# Patient Record
Sex: Female | Born: 1965 | Race: White | Hispanic: No | Marital: Married | Smoking: Never smoker
Health system: Southern US, Community
[De-identification: ages and names within clinical notes are randomized; demographics above are authoritative.]

## PROBLEM LIST (undated history)

## (undated) HISTORY — PX: ABDOMINAL HYSTERECTOMY: SHX81

---

## 2007-02-24 ENCOUNTER — Ambulatory Visit: Payer: Self-pay

## 2007-08-12 ENCOUNTER — Ambulatory Visit: Payer: Self-pay | Admitting: Family Medicine

## 2007-09-09 ENCOUNTER — Ambulatory Visit: Payer: Self-pay | Admitting: Family Medicine

## 2012-02-04 ENCOUNTER — Ambulatory Visit: Payer: Self-pay

## 2012-02-27 ENCOUNTER — Ambulatory Visit: Payer: Self-pay

## 2012-02-27 LAB — HEMATOCRIT: HCT: 39.3 % (ref 35.0–47.0)

## 2012-03-10 ENCOUNTER — Ambulatory Visit: Payer: Self-pay

## 2012-03-10 LAB — PREGNANCY, URINE: Pregnancy Test, Urine: NEGATIVE m[IU]/mL

## 2012-03-11 LAB — HEMOGLOBIN: HGB: 11.7 g/dL — ABNORMAL LOW (ref 12.0–16.0)

## 2012-03-12 LAB — PATHOLOGY REPORT

## 2015-04-16 NOTE — Op Note (Signed)
PATIENT NAME:  Catherine CroftYNE, Dulse M MR#:  [id number] DATE OF BIRTH:  February 06, 1966  DATE OF PROCEDURE:  03/10/2012  PREOPERATIVE DIAGNOSIS: Menorrhagia unresponsive to conservative therapy.   POSTOPERATIVE DIAGNOSIS: Menorrhagia unresponsive to conservative therapy.   PROCEDURE: Laparoscopic supracervical hysterectomy.   SURGEON: Deloris Pinghilip J. Luella Cookosenow, MD  FIRST ASSISTANT: Adria Devonarrie Klett, MD   OPERATIVE FINDINGS: Large boggy uterus, both tubes and ovaries appeared to be normal therefore left in situ.   DESCRIPTION OF PROCEDURE: After adequate general anesthesia, the patient was prepped and draped in routine fashion. An infraumbilical incision was made through the skin with instillation of approximately 3 liters of carbon dioxide without incident. During insufflation a uterine manipulator was placed and the bladder was drained. The laparoscope was then inserted. The above findings were noted. Accessory ports were placed in the left and right lower quadrants. The fundus of the uterus was grasped with a grasping tenaculum. The tube, suspensory ligament, ovaries, and the broad ligaments were serially clamped with the Harmonic scalpel and divided. Round ligaments likewise were divided with the Harmonic scalpel. A bladder flap was created, and the bladder was pushed down. The uterine vessels were coagulated with bipolar forceps on the left side. A like procedure was then carried out on the other side. The specimen was amputated using the Harmonic scalpel. The endocervical canal was cauterized with bipolar forceps. The pelvis was lavaged with copious amounts of saline. The specimen was removed using the morcellator. Interceed was then placed and CO2 was allowed to escape after a         fascial stitch closed with the morcellator site. The skin was closed in routine fashion. The patient tolerated the procedure well and left the Operating Room in good condition. Sponge and needle counts were said to be correct at the  end of the procedure. Estimated blood loss was 25 mL. ____________________________ Deloris PingPhilip J. Luella Cookosenow, MD pjr:slb D: 03/10/2012 12:40:26 ET T: 03/10/2012 13:07:48 ET JOB#: [id number]  cc: Deloris PingPhilip J. Luella Cookosenow, MD, <Dictator> Towana BadgerPHILIP J Sandrina Heaton MD ELECTRONICALLY SIGNED 03/16/2012 14:49

## 2016-10-10 ENCOUNTER — Other Ambulatory Visit: Payer: Self-pay | Admitting: Family Medicine

## 2016-10-10 DIAGNOSIS — Z1231 Encounter for screening mammogram for malignant neoplasm of breast: Secondary | ICD-10-CM

## 2016-10-11 ENCOUNTER — Other Ambulatory Visit: Payer: Self-pay | Admitting: Family Medicine

## 2016-10-11 ENCOUNTER — Ambulatory Visit
Admission: RE | Admit: 2016-10-11 | Discharge: 2016-10-11 | Disposition: A | Discharge status: Home / Self Care | Payer: Managed Care, Other (non HMO) | Source: Ambulatory Visit | Attending: Family Medicine | Admitting: Family Medicine

## 2016-10-11 DIAGNOSIS — Z1231 Encounter for screening mammogram for malignant neoplasm of breast: Secondary | ICD-10-CM | POA: Insufficient documentation

## 2016-10-17 ENCOUNTER — Other Ambulatory Visit: Payer: Self-pay | Admitting: *Deleted

## 2016-10-17 ENCOUNTER — Other Ambulatory Visit: Payer: Self-pay | Admitting: Family Medicine

## 2016-10-17 DIAGNOSIS — N6310 Unspecified lump in the right breast, unspecified quadrant: Secondary | ICD-10-CM

## 2016-10-31 ENCOUNTER — Ambulatory Visit
Admission: RE | Admit: 2016-10-31 | Discharge: 2016-10-31 | Disposition: A | Discharge status: Home / Self Care | Payer: Managed Care, Other (non HMO) | Source: Ambulatory Visit | Attending: Family Medicine | Admitting: Family Medicine

## 2016-10-31 DIAGNOSIS — N6310 Unspecified lump in the right breast, unspecified quadrant: Secondary | ICD-10-CM | POA: Insufficient documentation

## 2016-11-07 ENCOUNTER — Other Ambulatory Visit: Payer: Self-pay | Admitting: Family Medicine

## 2016-11-07 DIAGNOSIS — N6310 Unspecified lump in the right breast, unspecified quadrant: Secondary | ICD-10-CM

## 2016-11-11 ENCOUNTER — Ambulatory Visit
Admission: RE | Admit: 2016-11-11 | Discharge: 2016-11-11 | Disposition: A | Discharge status: Home / Self Care | Payer: Managed Care, Other (non HMO) | Source: Ambulatory Visit | Attending: Family Medicine | Admitting: Family Medicine

## 2016-11-11 DIAGNOSIS — N6310 Unspecified lump in the right breast, unspecified quadrant: Secondary | ICD-10-CM | POA: Diagnosis present

## 2016-11-11 HISTORY — PX: BREAST BIOPSY: SHX20

## 2016-11-12 LAB — SURGICAL PATHOLOGY

## 2017-11-20 ENCOUNTER — Ambulatory Visit
Admission: EM | Admit: 2017-11-20 | Discharge: 2017-11-20 | Disposition: A | Discharge status: Home / Self Care | Payer: 59 | Attending: Family Medicine | Admitting: Family Medicine

## 2017-11-20 ENCOUNTER — Other Ambulatory Visit: Payer: Self-pay

## 2017-11-20 ENCOUNTER — Encounter: Payer: Self-pay | Admitting: Emergency Medicine

## 2017-11-20 ENCOUNTER — Ambulatory Visit (INDEPENDENT_AMBULATORY_CARE_PROVIDER_SITE_OTHER): Payer: 59

## 2017-11-20 DIAGNOSIS — R05 Cough: Secondary | ICD-10-CM

## 2017-11-20 DIAGNOSIS — R062 Wheezing: Secondary | ICD-10-CM | POA: Diagnosis not present

## 2017-11-20 DIAGNOSIS — J069 Acute upper respiratory infection, unspecified: Secondary | ICD-10-CM

## 2017-11-20 DIAGNOSIS — R059 Cough, unspecified: Secondary | ICD-10-CM

## 2017-11-20 MED ORDER — HYDROCOD POLST-CPM POLST ER 10-8 MG/5ML PO SUER: 5.0000 mL | Freq: Every evening | ORAL | 0 refills | Status: DC | PRN

## 2017-11-20 MED ORDER — PREDNISONE 20 MG PO TABS: 40.0000 mg | ORAL_TABLET | Freq: Every day | ORAL | 0 refills | Status: DC

## 2017-11-20 MED ORDER — ALBUTEROL SULFATE HFA 108 (90 BASE) MCG/ACT IN AERS: 2.0000 | INHALATION_SPRAY | RESPIRATORY_TRACT | 0 refills | Status: AC | PRN

## 2017-11-20 MED ORDER — IPRATROPIUM-ALBUTEROL 0.5-2.5 (3) MG/3ML IN SOLN
3.0000 mL | Freq: Once | RESPIRATORY_TRACT | Status: AC
  Administered 2017-11-20: 3 mL via RESPIRATORY_TRACT

## 2017-11-20 MED ORDER — BENZONATATE 100 MG PO CAPS: 100.0000 mg | ORAL_CAPSULE | Freq: Three times a day (TID) | ORAL | 0 refills | Status: DC | PRN

## 2017-11-20 MED ORDER — DOXYCYCLINE HYCLATE 100 MG PO CAPS: 100.0000 mg | ORAL_CAPSULE | Freq: Two times a day (BID) | ORAL | 0 refills | Status: DC

## 2017-11-20 NOTE — Discharge Instructions (Signed)
Take medication as prescribed. Rest. Drink plenty of fluids.  ° °Follow up with your primary care physician this week as needed. Return to Urgent care for new or worsening concerns.  ° °

## 2017-11-20 NOTE — ED Provider Notes (Signed)
[facility] ____________________________________________  Time seen: Approximately 8:56 AM  I have reviewed the triage vital signs and the nursing notes.   HISTORY  Chief Complaint Cough   HPI Catherine Hogan is a 51 y.o. female presenting for evaluation of 1.5 weeks of cough and chest congestion.  States occasional throat irritation, mostly associated with cough.  Denies nasal congestion or sinus pain.  States may have intermittently felt she has had a fever, has not actually measured her temperature.  States has been taking over-the-counter ibuprofen, Delsym and some cough and congestion combination agents with minimal improvement, no resolution.  States over the last 4 days the cough and chest congestion has increased.  States cough is worse at night.  States cough is occasionally productive of thick whitish and thick greenish mucus.  Denies pain with breathing.  States felt like she occasionally has heard herself wheeze, mostly at night.  Denies history of asthma, pneumonia or COPD.  Not a smoker.  States her son recently was sick with some similar, as well as her husband who had pneumonia.  Denies chest pain.  Reports continues to eat and drink well.  Reports continues to remain active.  Denies history of PE or DVT.  Denies recent surgery or immobilization.  Reports otherwise feels well. Denies chest pain, shortness of breath, abdominal pain, dysuria, extremity pain, extremity swelling or rash. Denies recent sickness. Denies recent antibiotic use.    Cyndie Mull, DO: PCP No LMP recorded. Patient has had a hysterectomy.   History reviewed. No pertinent past medical history.  There are no active problems to display for this patient.   Past Surgical History:  Procedure Laterality Date  . ABDOMINAL HYSTERECTOMY       No current facility-administered medications for this encounter.   Current Outpatient Medications:  .  albuterol (PROVENTIL HFA;VENTOLIN  HFA) 108 (90 Base) MCG/ACT inhaler, Inhale 2 puffs into the lungs every 4 (four) hours as needed for wheezing or shortness of breath., Disp: 1 Inhaler, Rfl: 0 .  benzonatate (TESSALON PERLES) 100 MG capsule, Take 1 capsule (100 mg total) by mouth 3 (three) times daily as needed for cough., Disp: 15 capsule, Rfl: 0 .  chlorpheniramine-HYDROcodone (TUSSIONEX PENNKINETIC ER) 10-8 MG/5ML SUER, Take 5 mLs by mouth at bedtime as needed for cough. do not drive or operate machinery while taking as can cause drowsiness., Disp: 75 mL, Rfl: 0 .  doxycycline (VIBRAMYCIN) 100 MG capsule, Take 1 capsule (100 mg total) by mouth 2 (two) times daily., Disp: 20 capsule, Rfl: 0 .  predniSONE (DELTASONE) 20 MG tablet, Take 2 tablets (40 mg total) by mouth daily., Disp: 10 tablet, Rfl: 0  Allergies Patient has no known allergies.  Family History  Problem Relation Age of Onset  . Diabetes Father   . Hypertension Father     Social History Social History   Tobacco Use  . Smoking status: Never Smoker  . Smokeless tobacco: Never Used  Substance Use Topics  . Alcohol use: Yes  . Drug use: No    Review of Systems Constitutional: No fever/chills ENT: As above.  Cardiovascular: Denies chest pain. Respiratory: Denies shortness of breath. Gastrointestinal: No abdominal pain.  No nausea, no vomiting.  No diarrhea.  Genitourinary: Negative for dysuria. Musculoskeletal: Negative for back pain. Skin: Negative for rash.   ____________________________________________   PHYSICAL EXAM:  VITAL SIGNS: ED Triage Vitals  Enc Vitals Group     BP 11/20/17 0835 128/78     Pulse Rate  11/20/17 0835 95     Resp 11/20/17 0835 14     Temp 11/20/17 0835 98.6 F (37 C)     Temp Source 11/20/17 0835 Oral     SpO2 11/20/17 0835 98 %     Weight 11/20/17 0832 179 lb 6.4 oz (81.4 kg)     Height 11/20/17 0832 5\' 5"  (1.651 m)     Head Circumference --      Peak Flow --      Pain Score 11/20/17 0832 5     Pain Loc --       Pain Edu? --      Excl. in GC? --    Constitutional: Alert and oriented. Well appearing and in no acute distress. Eyes: Conjunctivae are normal.  Head: Atraumatic. No sinus tenderness to palpation. No swelling. No erythema.  Ears: no erythema, normal TMs bilaterally.   Nose:No nasal congestion   Mouth/Throat: Mucous membranes are moist. No pharyngeal erythema. No tonsillar swelling or exudate.  Neck: No stridor.  No cervical spine tenderness to palpation. Hematological/Lymphatic/Immunilogical: No cervical lymphadenopathy. Cardiovascular: Normal rate, regular rhythm. Grossly normal heart sounds.  Good peripheral circulation. Respiratory: Normal respiratory effort.  No retractions.  Scattered rhonchi noted, increased left lower and right upper.  Intermittent cough noted with bronchospasm.  Speaks in complete sentences.  Good air movement.  Musculoskeletal: Ambulatory with steady gait. No cervical, thoracic or lumbar tenderness to palpation.  No lower extremity edema noted. Neurologic:  Normal speech and language. No gait instability. Skin:  Skin appears warm, dry and intact. No rash noted. Psychiatric: Mood and affect are normal. Speech and behavior are normal.  ___________________________________________   LABS (all labs ordered are listed, but only abnormal results are displayed)  Labs Reviewed - No data to display   Dg Chest 2 View  Result Date: 11/20/2017 CLINICAL DATA:  Cough with congestion, tightness, wheezing for 1 week EXAM: CHEST  2 VIEW COMPARISON:  Chest x-ray of 08/12/2007 FINDINGS: No pneumonia or effusion is seen. There is mild peribronchial thickening which may indicate bronchitis. Mediastinal and hilar contours are otherwise unremarkable. The heart is within normal limits in size. No bony abnormality is seen. IMPRESSION: No pneumonia or effusion.  Possible bronchitis. Electronically Signed   By: Dwyane DeePaul  Barry M.D.   On: 11/20/2017 09:15    ____________________________________________   PROCEDURES Procedures    INITIAL IMPRESSION / ASSESSMENT AND PLAN / ED COURSE  Pertinent labs & imaging results that were available during my care of the patient were reviewed by me and considered in my medical decision making (see chart for details).  Well-appearing patient.  No acute distress.  Will evaluate chest x-ray, DuoNeb administered in urgent care.  Patient reexamined after DuoNeb, much improved, wheezes fully resolved and rhonchi improved.  Patient chest x-ray reviewed,infiltrate noted, no pneumonia or effusion, possible bronchitis.  Suspect recent viral respiratory infection, concern for secondary infection.  Will treat patient with prednisone, albuterol inhaler, as needed Tessalon Perles, doxycycline.  Encourage rest, fluids, supportive care.  Discussed strict follow-up and return parameters.Discussed indication, risks and benefits of medications with patient  Discussed follow up with Primary care physician this week. Discussed follow up and return parameters including no resolution or any worsening concerns. Patient verbalized understanding and agreed to plan.    ____________________________________________   FINAL CLINICAL IMPRESSION(S) / ED DIAGNOSES  Final diagnoses:  Cough  Upper respiratory tract infection, unspecified type     ED Discharge Orders  Ordered    doxycycline (VIBRAMYCIN) 100 MG capsule  2 times daily     11/20/17 0927    predniSONE (DELTASONE) 20 MG tablet  Daily     11/20/17 0927    albuterol (PROVENTIL HFA;VENTOLIN HFA) 108 (90 Base) MCG/ACT inhaler  Every 4 hours PRN     11/20/17 0927    chlorpheniramine-HYDROcodone (TUSSIONEX PENNKINETIC ER) 10-8 MG/5ML SUER  At bedtime PRN     11/20/17 0927    benzonatate (TESSALON PERLES) 100 MG capsule  3 times daily PRN     11/20/17 [id number]       Note: This dictation was prepared with Dragon dictation along with smaller phrase technology. Any  transcriptional errors that result from this process are unintentional.         Renford DillsMiller, Marylin Lathon, NP 11/20/17 [phone]

## 2017-11-20 NOTE — ED Triage Notes (Signed)
Patient c/o cough and chest congestion that started last Wed.

## 2017-12-14 ENCOUNTER — Other Ambulatory Visit: Payer: Self-pay

## 2017-12-14 ENCOUNTER — Ambulatory Visit
Admission: EM | Admit: 2017-12-14 | Discharge: 2017-12-14 | Disposition: A | Discharge status: Home / Self Care | Payer: 59 | Attending: Emergency Medicine | Admitting: Emergency Medicine

## 2017-12-14 DIAGNOSIS — B029 Zoster without complications: Secondary | ICD-10-CM

## 2017-12-14 MED ORDER — HYDROCODONE-ACETAMINOPHEN 5-325 MG PO TABS: 1.0000 | ORAL_TABLET | Freq: Four times a day (QID) | ORAL | 0 refills | Status: DC | PRN

## 2017-12-14 MED ORDER — VALACYCLOVIR HCL 1 G PO TABS: 1000.0000 mg | ORAL_TABLET | Freq: Three times a day (TID) | ORAL | 0 refills | Status: AC

## 2017-12-14 MED ORDER — IBUPROFEN 600 MG PO TABS: 600.0000 mg | ORAL_TABLET | Freq: Four times a day (QID) | ORAL | 0 refills | Status: AC | PRN

## 2017-12-14 MED ORDER — HYDROCODONE-ACETAMINOPHEN 5-325 MG PO TABS: 1.0000 | ORAL_TABLET | Freq: Four times a day (QID) | ORAL | 0 refills | Status: AC | PRN

## 2017-12-14 MED ORDER — IBUPROFEN 600 MG PO TABS: 600.0000 mg | ORAL_TABLET | Freq: Four times a day (QID) | ORAL | 0 refills | Status: DC | PRN

## 2017-12-14 NOTE — ED Triage Notes (Signed)
Pt with pain to left upper back/shoulder area chest soreness. [place] [facility] [place] to be sure it wasn't cardiac. Now has developed some rash. "I'm worried I have shingles."

## 2017-12-14 NOTE — ED Provider Notes (Signed)
HPI  SUBJECTIVE:  Catherine Hogan is a 51 y.o. female who presents with posterior shoulder pain described as sharp, throbbing, constant which has now migrated anteriorly starting 4 days ago in a dermatomal distribution.  She now describes a rash in the area of the pain along the anterior chest.  She tried ibuprofen with improvement in her symptoms.  No aggravating factors.  Pain is not associated with shoulder movement, palpation.  Last dose of ibuprofen was within 6-8 hours of evaluation.  She reports getting an EKG 4 days ago, which was normal.  No new foods, lotions, soaps, detergents, change in her medications.  She has a past medical history of chickenpox, shingles in her leg.  No history of diabetes, hypertension.  PMD: Catherine Hogan, Gloria Patricia, DO  History reviewed. No pertinent past medical history.  Past Surgical History:  Procedure Laterality Date  . ABDOMINAL HYSTERECTOMY      Family History  Problem Relation Age of Onset  . Diabetes Father   . Hypertension Father     Social History   Tobacco Use  . Smoking status: Never Smoker  . Smokeless tobacco: Never Used  Substance Use Topics  . Alcohol use: Yes    Comment: social  . Drug use: No    No current facility-administered medications for this encounter.   Current Outpatient Medications:  .  albuterol (PROVENTIL HFA;VENTOLIN HFA) 108 (90 Base) MCG/ACT inhaler, Inhale 2 puffs into the lungs every 4 (four) hours as needed for wheezing or shortness of breath., Disp: 1 Inhaler, Rfl: 0 .  HYDROcodone-acetaminophen (NORCO/VICODIN) 5-325 MG tablet, Take 1-2 tablets by mouth every 6 (six) hours as needed for moderate pain., Disp: 20 tablet, Rfl: 0 .  ibuprofen (ADVIL,MOTRIN) 600 MG tablet, Take 1 tablet (600 mg total) by mouth every 6 (six) hours as needed., Disp: 20 tablet, Rfl: 0 .  valACYclovir (VALTREX) 1000 MG tablet, Take 1 tablet (1,000 mg total) by mouth 3 (three) times daily. X 7 days, Disp: 21 tablet, Rfl: 0  No  Known Allergies   ROS  As noted in HPI.   Physical Exam  BP 127/75 (BP Location: Left Arm)   Pulse 87   Temp 98.3 F (36.8 C) (Oral)   Resp 16   SpO2 100%   Constitutional: Well developed, well nourished, no acute distress Eyes:  EOMI, conjunctiva normal bilaterally HENT: Normocephalic, atraumatic,mucus membranes moist Respiratory: Normal inspiratory effort Cardiovascular: Normal rate GI: nondistended Skin: Erythematous papules in the left T2 distribution anteriorly.  No other rash.  No other tenderness along this distribution    Musculoskeletal: no deformities no tenderness in the shoulder, scapula or pain with movement.  No C-spine, T-spine tenderness Neurologic: Alert & oriented x 3, no focal neuro deficits Psychiatric: Speech and behavior appropriate   ED Course   Medications - No data to display  No orders of the defined types were placed in this encounter.   No results found for this or any previous visit (from the past 24 hour(s)). No results found.  ED Clinical Impression  Herpes zoster without complication   ED Assessment/Plan  [place] Narcotic database reviewed for this patient, and feel that the risk/benefit ratio today is favorable for proceeding with a prescription for controlled substance.  No opiate prescriptions in the past 2 years.  Presentation consistent with shingles.  Home with Valtrex, ibuprofen to take with 1 g of Tylenol 3-4 times a day as needed for pain, Norco for severe pain.  Follow-up with primary care physician  in several days, to the ER if she gets worse.  Discussed MDM, plan and followup with patient. Discussed sn/sx that should prompt return to the ED. patient agrees with plan.   Meds ordered this encounter  Medications  . valACYclovir (VALTREX) 1000 MG tablet    Sig: Take 1 tablet (1,000 mg total) by mouth 3 (three) times daily. X 7 days    Dispense:  21 tablet    Refill:  0  . DISCONTD: HYDROcodone-acetaminophen  (NORCO/VICODIN) 5-325 MG tablet    Sig: Take 1-2 tablets by mouth every 6 (six) hours as needed for moderate pain.    Dispense:  20 tablet    Refill:  0  . DISCONTD: ibuprofen (ADVIL,MOTRIN) 600 MG tablet    Sig: Take 1 tablet (600 mg total) by mouth every 6 (six) hours as needed.    Dispense:  20 tablet    Refill:  0  . HYDROcodone-acetaminophen (NORCO/VICODIN) 5-325 MG tablet    Sig: Take 1-2 tablets by mouth every 6 (six) hours as needed for moderate pain.    Dispense:  20 tablet    Refill:  0  . ibuprofen (ADVIL,MOTRIN) 600 MG tablet    Sig: Take 1 tablet (600 mg total) by mouth every 6 (six) hours as needed.    Dispense:  20 tablet    Refill:  0    *This clinic note was created using Scientist, clinical (histocompatibility and immunogenetics)Dragon dictation software. Therefore, there may be occasional mistakes despite careful proofreading.   ?   Domenick GongMortenson, Joslynne Klatt, MD 12/14/17 1800

## 2018-01-12 ENCOUNTER — Other Ambulatory Visit: Payer: Self-pay | Admitting: Family Medicine

## 2018-01-12 DIAGNOSIS — Z1231 Encounter for screening mammogram for malignant neoplasm of breast: Secondary | ICD-10-CM

## 2018-01-27 ENCOUNTER — Ambulatory Visit
Admission: RE | Admit: 2018-01-27 | Discharge: 2018-01-27 | Disposition: A | Discharge status: Home / Self Care | Payer: 59 | Source: Ambulatory Visit | Attending: Family Medicine | Admitting: Family Medicine

## 2018-01-27 DIAGNOSIS — Z1231 Encounter for screening mammogram for malignant neoplasm of breast: Secondary | ICD-10-CM | POA: Diagnosis not present

## 2019-09-08 ENCOUNTER — Other Ambulatory Visit: Payer: Self-pay | Admitting: Gastroenterology

## 2019-09-08 DIAGNOSIS — R1013 Epigastric pain: Secondary | ICD-10-CM

## 2019-09-13 ENCOUNTER — Ambulatory Visit
Admission: RE | Admit: 2019-09-13 | Discharge: 2019-09-13 | Disposition: A | Discharge status: Home / Self Care | Payer: BC Managed Care – PPO | Source: Ambulatory Visit | Attending: Gastroenterology | Admitting: Gastroenterology

## 2019-09-13 ENCOUNTER — Other Ambulatory Visit: Payer: Self-pay

## 2019-09-13 DIAGNOSIS — R1013 Epigastric pain: Secondary | ICD-10-CM | POA: Diagnosis not present

## 2019-11-10 IMAGING — US US ABDOMEN LIMITED
1 series · 14 of 25 positions shown · non-contrast
Comparison: None.

CLINICAL DATA: Epigastric pain

EXAM:
ULTRASOUND ABDOMEN LIMITED RIGHT UPPER QUADRANT

[Series 1: us abdomen limited · 0.24mm/px · 14 of 54 slices shown]
[im 1/54]
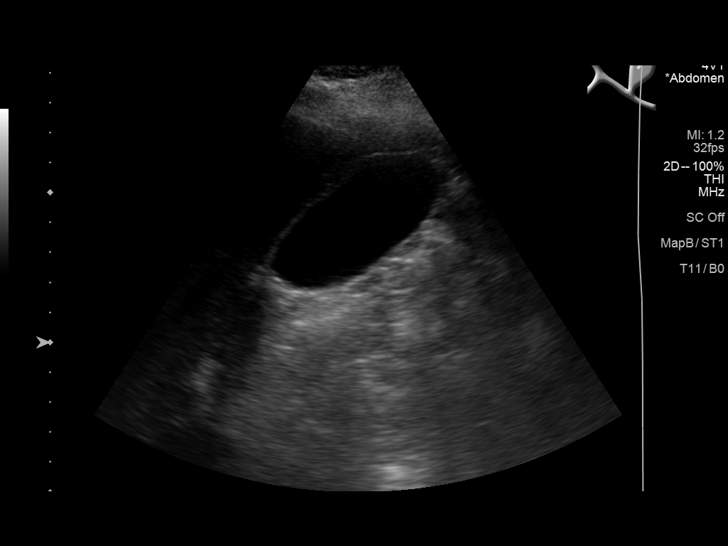
[im 5/54]
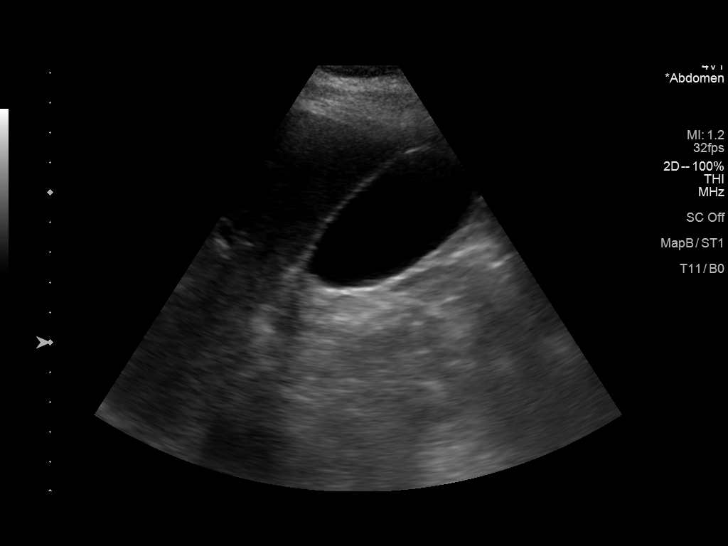
[im 9/54]
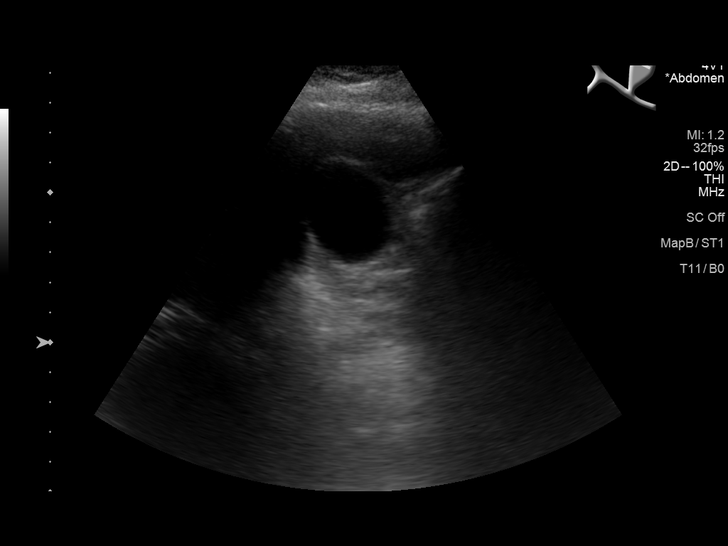
[im 14/54]
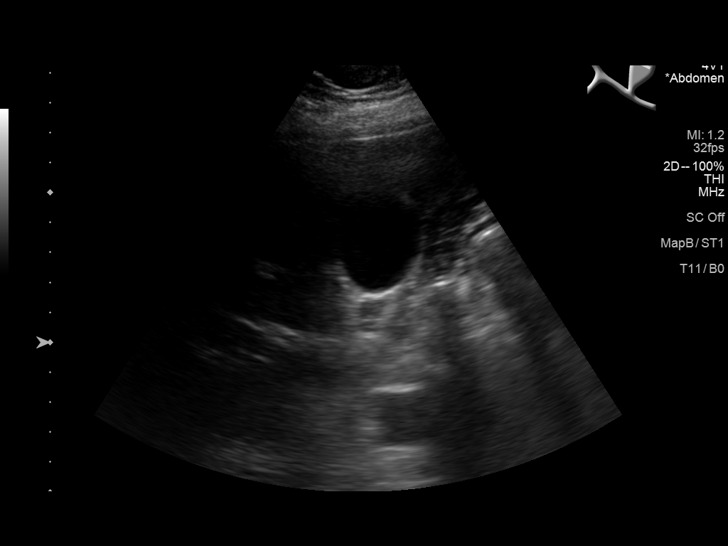
[im 18/54]
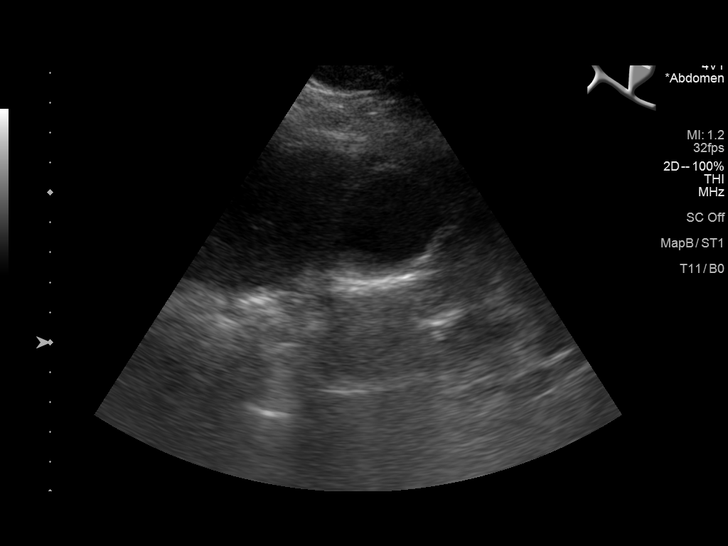
[im 20/54]
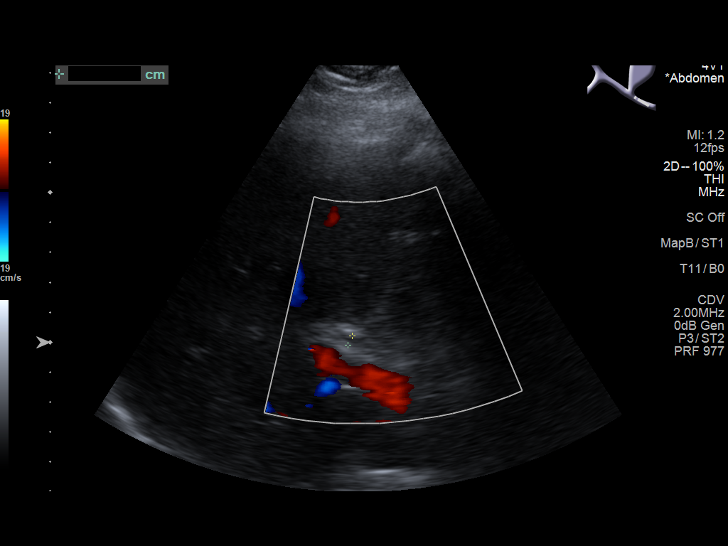
[im 25/54]
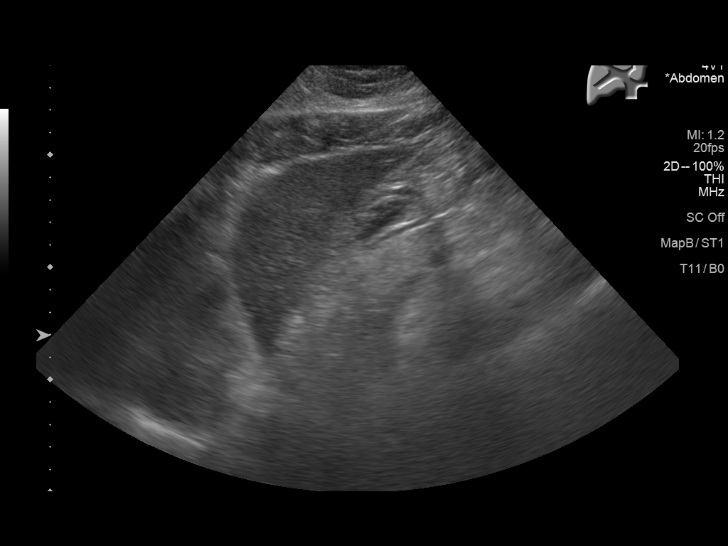
[im 29/54]
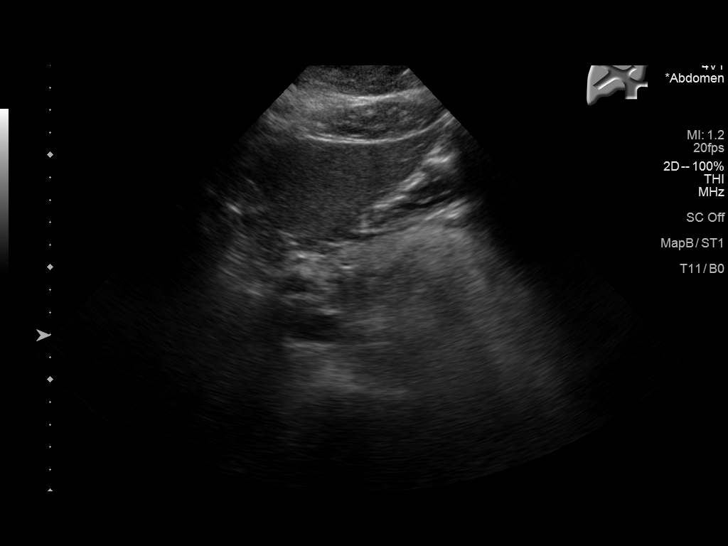
[im 34/54]
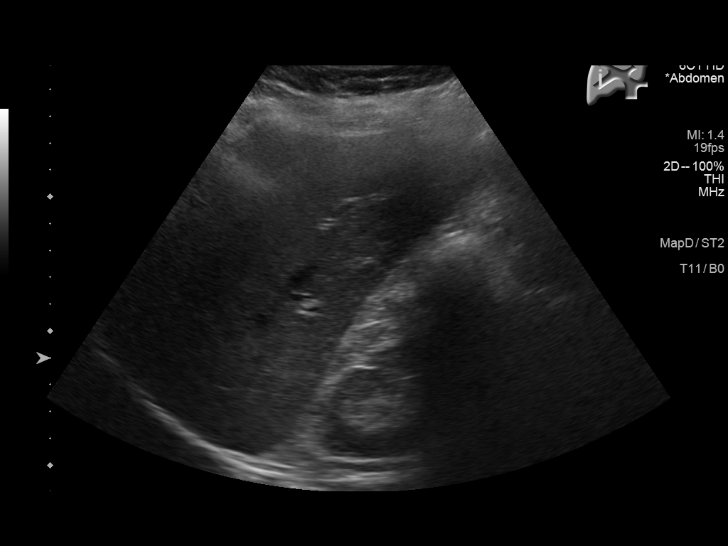
[im 36/54]
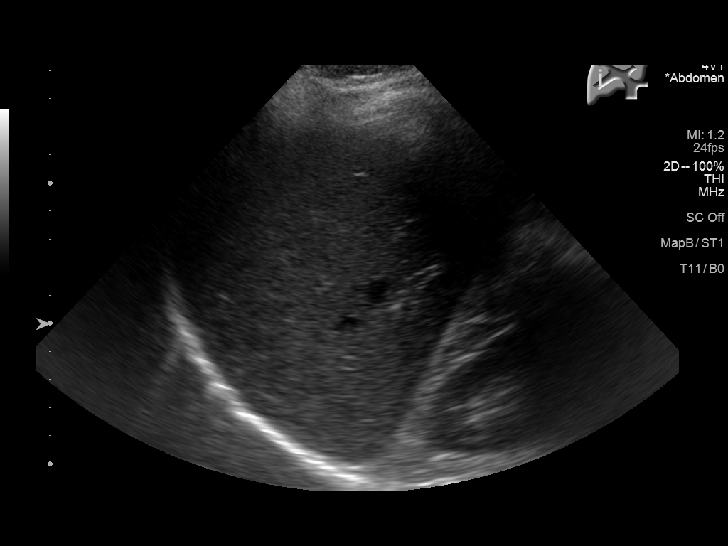
[im 40/54]
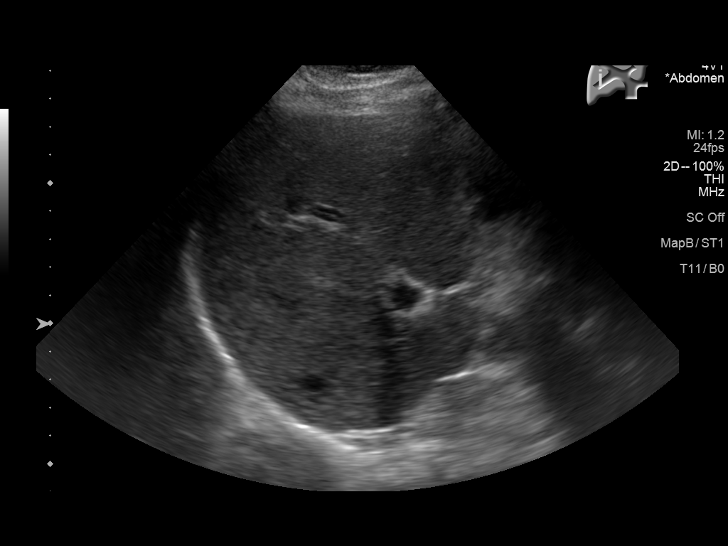
[im 45/54]
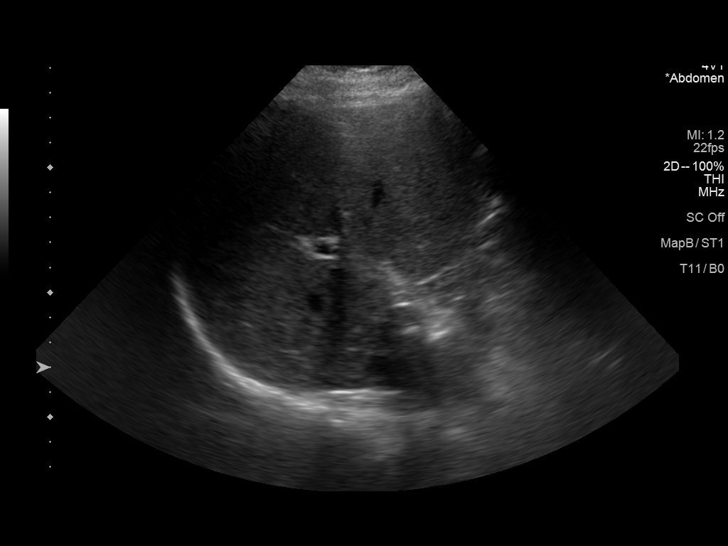
[im 49/54]
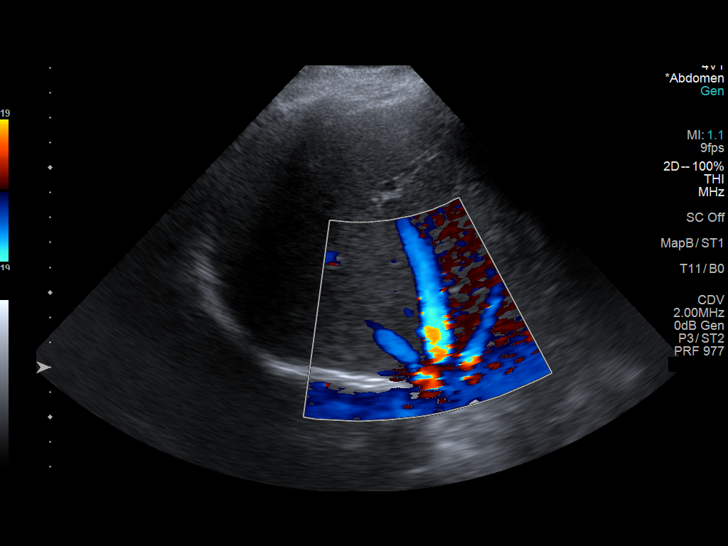
[im 54/54]
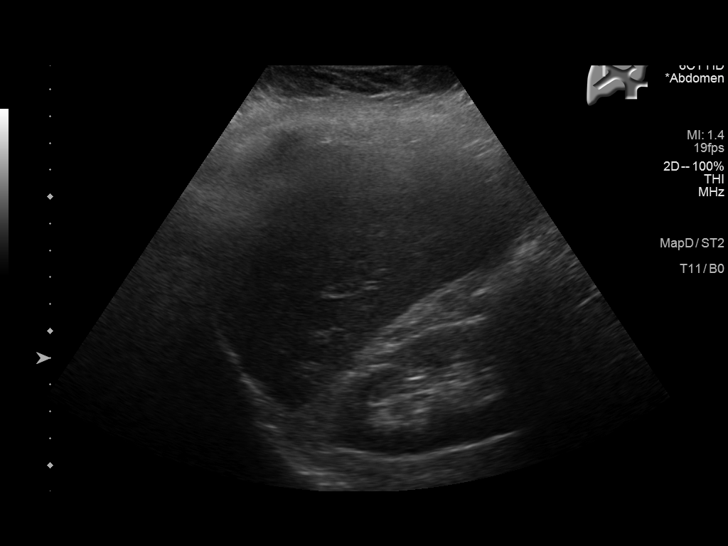

[14 of 25 positions shown; findings below may reference images not displayed]

FINDINGS: Gallbladder:

No gallstones or wall thickening visualized. No sonographic Murphy
sign noted by sonographer.

Common bile duct:

Diameter: 0.3 cm

Liver:

No focal lesion identified. Within normal limits in parenchymal
echogenicity. Portal vein is patent on color Doppler imaging with
normal direction of blood flow towards the liver.

Other: None.
IMPRESSION: Normal study.

## 2021-01-31 ENCOUNTER — Other Ambulatory Visit: Payer: Self-pay | Admitting: Family Medicine

## 2021-02-09 ENCOUNTER — Other Ambulatory Visit: Payer: Self-pay | Admitting: Family Medicine

## 2021-02-09 DIAGNOSIS — Z1231 Encounter for screening mammogram for malignant neoplasm of breast: Secondary | ICD-10-CM

## 2021-02-12 ENCOUNTER — Other Ambulatory Visit: Payer: Self-pay

## 2021-02-12 ENCOUNTER — Ambulatory Visit
Admission: RE | Admit: 2021-02-12 | Discharge: 2021-02-12 | Disposition: A | Discharge status: Home / Self Care | Payer: BC Managed Care – PPO | Source: Ambulatory Visit | Attending: Family Medicine | Admitting: Family Medicine

## 2021-02-12 DIAGNOSIS — Z1231 Encounter for screening mammogram for malignant neoplasm of breast: Secondary | ICD-10-CM | POA: Diagnosis not present

## 2022-02-05 ENCOUNTER — Other Ambulatory Visit: Payer: Self-pay | Admitting: Family Medicine

## 2022-02-05 DIAGNOSIS — Z1231 Encounter for screening mammogram for malignant neoplasm of breast: Secondary | ICD-10-CM

## 2022-04-02 ENCOUNTER — Ambulatory Visit
Admission: RE | Admit: 2022-04-02 | Discharge: 2022-04-02 | Disposition: A | Discharge status: Home / Self Care | Payer: BC Managed Care – PPO | Source: Ambulatory Visit | Attending: Family Medicine | Admitting: Family Medicine

## 2022-04-02 DIAGNOSIS — Z1231 Encounter for screening mammogram for malignant neoplasm of breast: Secondary | ICD-10-CM | POA: Diagnosis present

## 2022-05-30 IMAGING — MG MM DIGITAL SCREENING BILAT W/ TOMO AND CAD
8 series · 8 of 24 positions shown · non-contrast
Comparison: Previous exam(s).

CLINICAL DATA: Screening.

EXAM:
DIGITAL SCREENING BILATERAL MAMMOGRAM WITH TOMOSYNTHESIS AND CAD
TECHNIQUE: Bilateral screening digital craniocaudal and mediolateral oblique
mammograms were obtained. Bilateral screening digital breast
tomosynthesis was performed. The images were evaluated with
computer-aided detection.

[L MLO synth-2D]
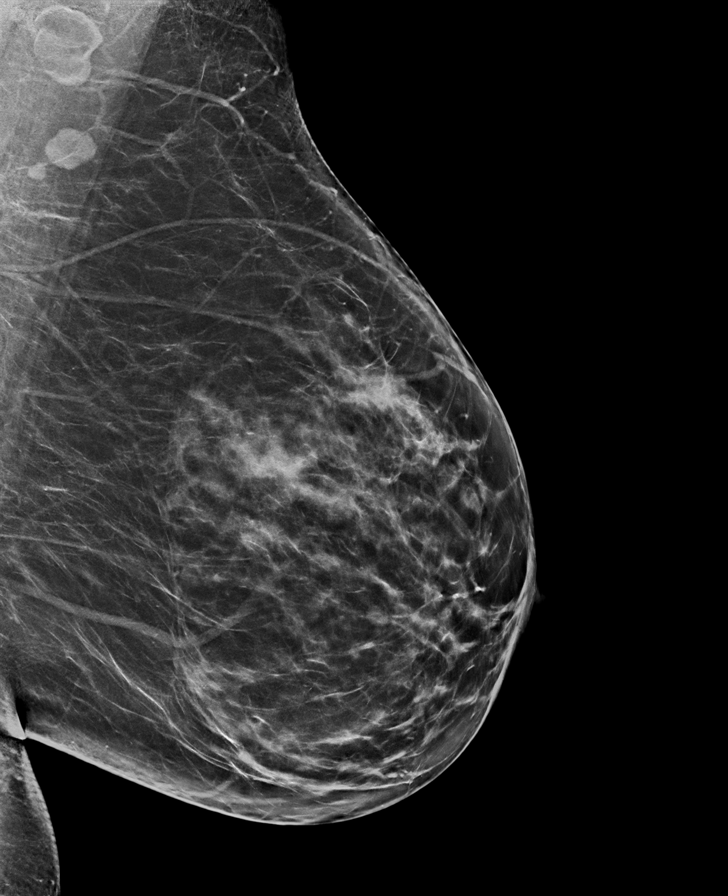

[R MLO synth-2D]
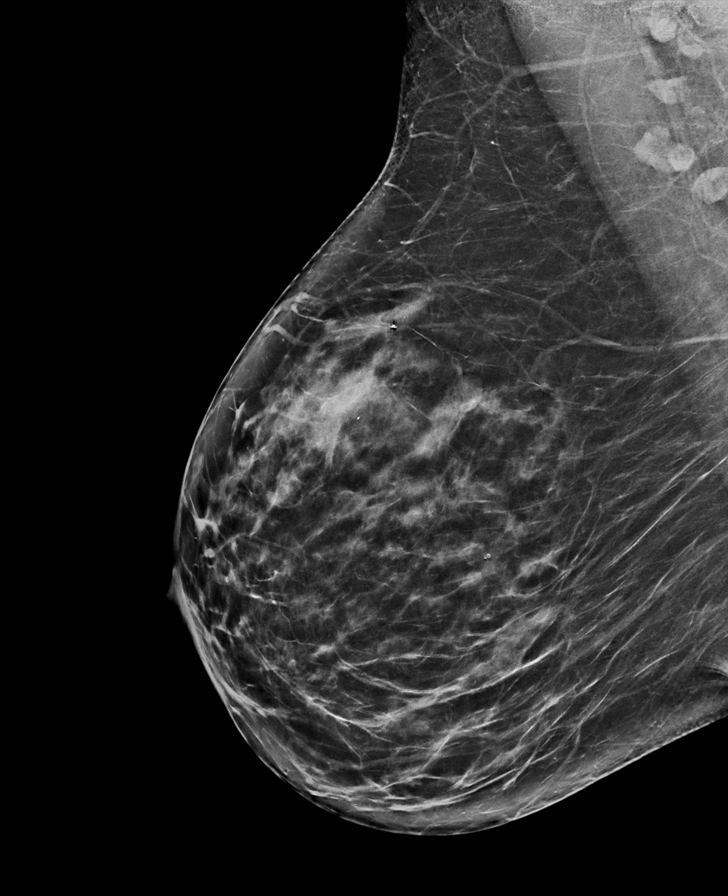

[R CC synth-2D]
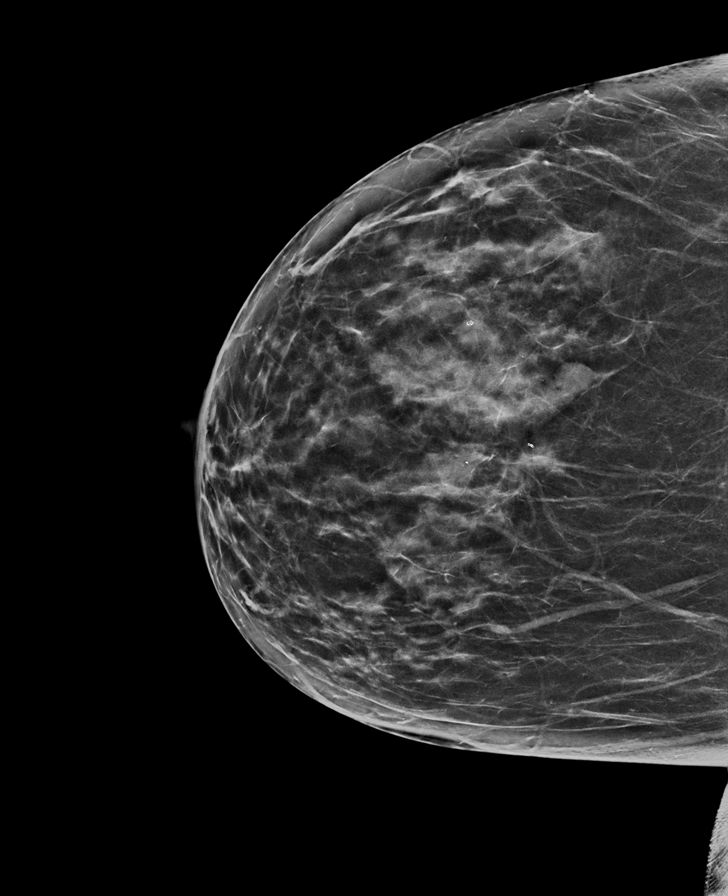

[L CC synth-2D]
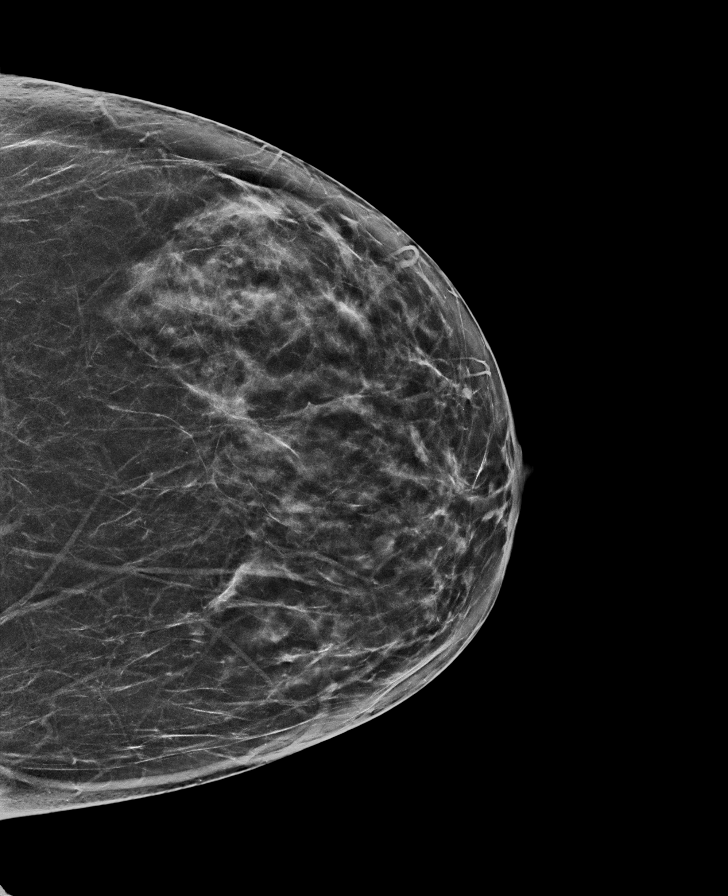

[R MLO tomo · tomo slice 39/77.0]
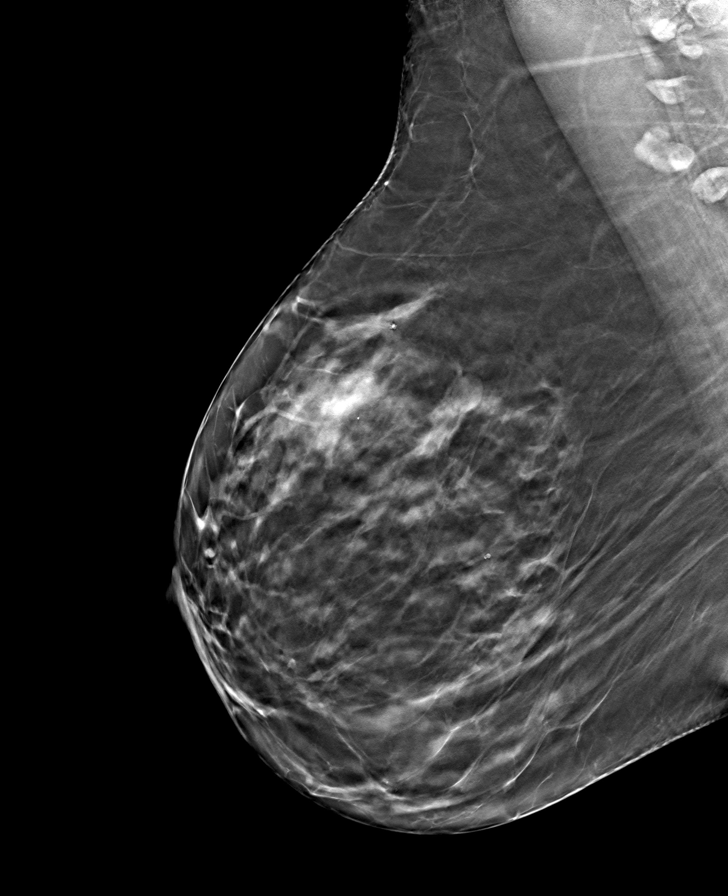

[L CC tomo · tomo slice 37/73.0]
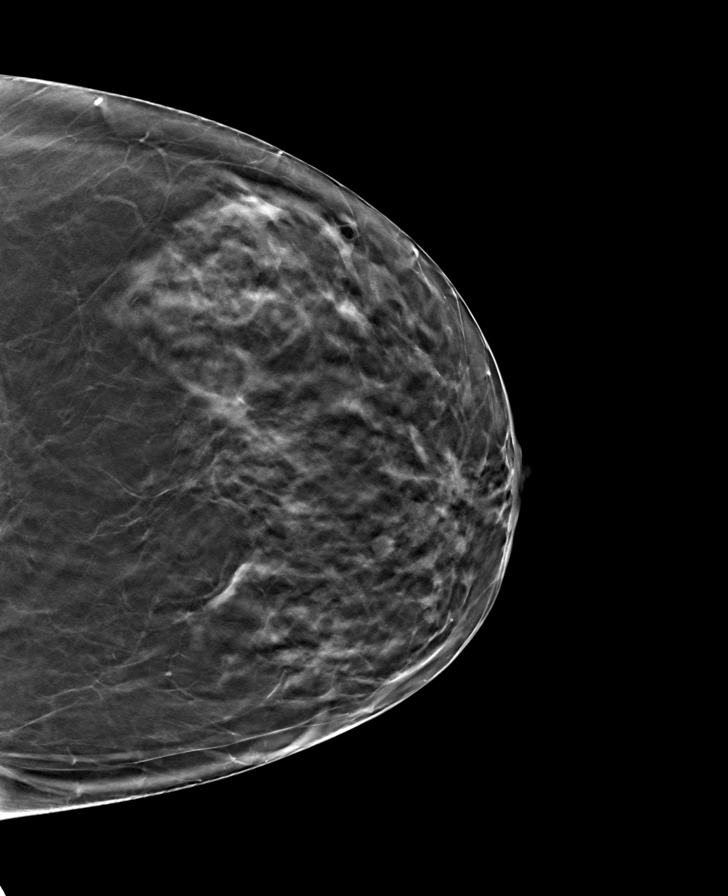

[L MLO tomo · tomo slice 40/79.0]
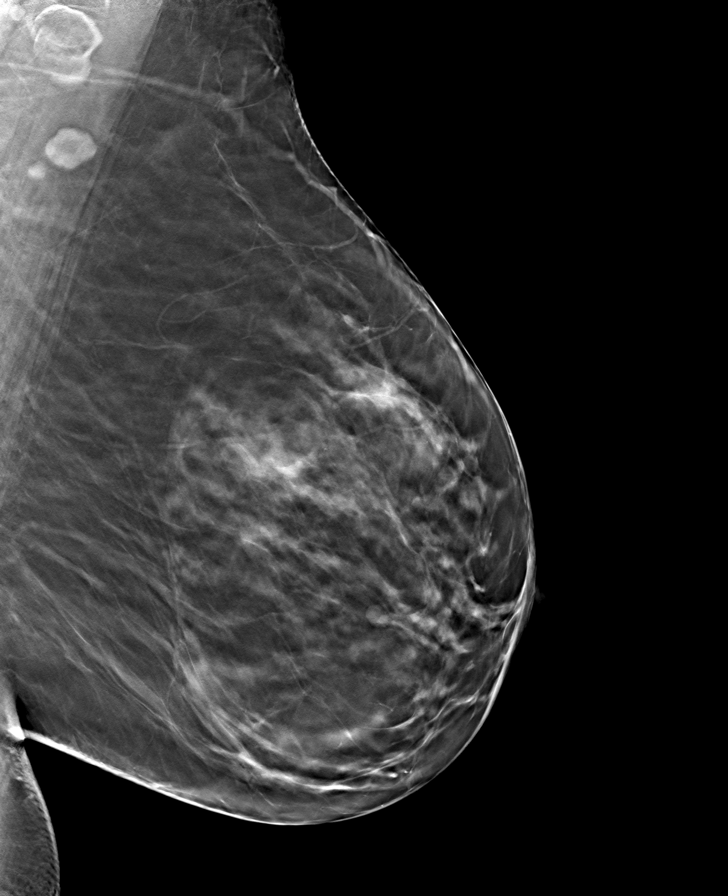

[R CC tomo · tomo slice 37/73.0]
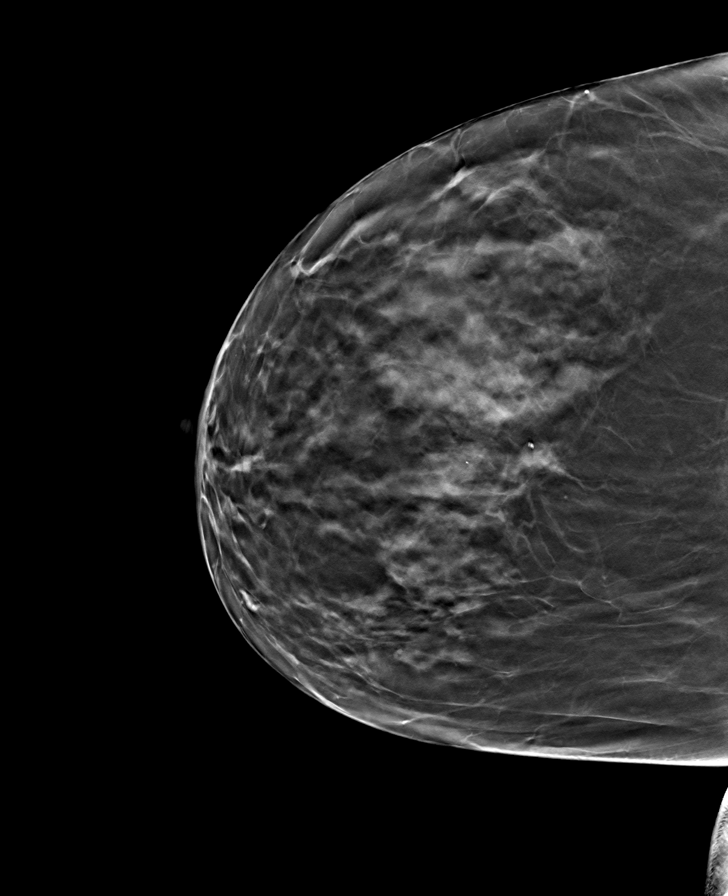

[8 of 24 positions shown; findings below may reference images not displayed]

ACR Breast Density Category c: The breast tissue is heterogeneously
dense, which may obscure small masses.
FINDINGS: There are no findings suspicious for malignancy. There are multiple
round and oval masses which have waxed and waned since prior exam.
These are most consistent with benign cysts.
IMPRESSION: No mammographic evidence of malignancy. A result letter of this
screening mammogram will be mailed directly to the patient.

RECOMMENDATION:
Screening mammogram in one year. (Code:Y4-N-LHE)

BI-RADS CATEGORY  2: Benign.

## 2023-01-08 ENCOUNTER — Encounter: Payer: Self-pay | Admitting: Emergency Medicine

## 2023-01-08 ENCOUNTER — Ambulatory Visit
Admission: EM | Admit: 2023-01-08 | Discharge: 2023-01-08 | Disposition: A | Discharge status: Other Acute Inpatient Hospital | Payer: Worker's Compensation

## 2023-01-08 NOTE — ED Triage Notes (Signed)
W/C- Patient was coming down steps at work and missed a step and fell. She injured her left ankle. She was seen at [facility] today, but advised by her employer that she needs to be seen at Urgent Care.

## 2023-01-08 NOTE — ED Provider Notes (Signed)
  [facility]    CSN: [id number] Arrival date & time: 01/08/23  1812      History   Chief Complaint Chief Complaint  Patient presents with   Fall    HPI Catherine Hogan is a 57 y.o. female presents for [employer] injury.  Patient has already been seen by [facility] and treated for this injury but was told by her employer she needed to follow-up with [place].  As she has already had initial treatment advised that she should follow-up with our occupational medicine clinic tomorrow.  No exam or treatment was performed today.     Melynda Ripple, NP 01/08/23 1850

## 2023-05-29 ENCOUNTER — Other Ambulatory Visit: Payer: Self-pay | Admitting: Internal Medicine

## 2023-05-29 DIAGNOSIS — Z1231 Encounter for screening mammogram for malignant neoplasm of breast: Secondary | ICD-10-CM

## 2023-06-17 ENCOUNTER — Ambulatory Visit
Admission: RE | Admit: 2023-06-17 | Discharge: 2023-06-17 | Disposition: A | Discharge status: Home / Self Care | Payer: BC Managed Care – PPO | Source: Ambulatory Visit | Attending: Internal Medicine | Admitting: Internal Medicine

## 2023-06-17 DIAGNOSIS — Z1231 Encounter for screening mammogram for malignant neoplasm of breast: Secondary | ICD-10-CM | POA: Diagnosis present

## 2024-07-14 ENCOUNTER — Other Ambulatory Visit: Payer: Self-pay | Admitting: Family Medicine

## 2024-07-14 DIAGNOSIS — Z1231 Encounter for screening mammogram for malignant neoplasm of breast: Secondary | ICD-10-CM

## 2024-08-19 ENCOUNTER — Ambulatory Visit
Admission: RE | Admit: 2024-08-19 | Discharge: 2024-08-19 | Disposition: A | Discharge status: Home / Self Care | Source: Ambulatory Visit | Attending: Family Medicine | Admitting: Family Medicine

## 2024-08-19 DIAGNOSIS — Z1231 Encounter for screening mammogram for malignant neoplasm of breast: Secondary | ICD-10-CM | POA: Diagnosis present
